# Patient Record
Sex: Male | Born: 1985 | Race: White | Hispanic: No | Marital: Single | Smoking: Current some day smoker
Health system: Southern US, Community
[De-identification: ages and names within clinical notes are randomized; demographics above are authoritative.]

## PROBLEM LIST (undated history)

## (undated) DIAGNOSIS — F259 Schizoaffective disorder, unspecified: Secondary | ICD-10-CM

## (undated) HISTORY — PX: HAND SURGERY: SHX662

---

## 2014-12-08 ENCOUNTER — Emergency Department (INDEPENDENT_AMBULATORY_CARE_PROVIDER_SITE_OTHER): Payer: Worker's Compensation

## 2014-12-08 ENCOUNTER — Emergency Department (INDEPENDENT_AMBULATORY_CARE_PROVIDER_SITE_OTHER)
Admission: EM | Admit: 2014-12-08 | Discharge: 2014-12-08 | Disposition: A | Discharge status: Home / Self Care | Payer: Worker's Compensation | Source: Home / Self Care | Attending: Family Medicine | Admitting: Family Medicine

## 2014-12-08 ENCOUNTER — Encounter (HOSPITAL_COMMUNITY): Payer: Self-pay | Admitting: *Deleted

## 2014-12-08 DIAGNOSIS — S6991XA Unspecified injury of right wrist, hand and finger(s), initial encounter: Secondary | ICD-10-CM

## 2014-12-08 HISTORY — DX: Schizoaffective disorder, unspecified: F25.9

## 2014-12-08 NOTE — ED Provider Notes (Signed)
CSN: [id number]     Arrival date & time 12/08/14  1748 History   First MD Initiated Contact with Patient 12/08/14 1830     Chief Complaint  Patient presents with  . Wrist Pain   (Consider location/radiation/quality/duration/timing/severity/associated sxs/prior Treatment) Patient is a 29 y.o. male presenting with wrist pain. The history is provided by the patient. No language interpreter was used.  Wrist Pain  Patient presents with complaint of right wrist pain, started yesterday at 2pm at work while applying force downward on a wrench.  Felt a sudden sharp pain along the dorsum/extensor surface of right wrist, heard a 'pop' sound.  The sharp pain subsided somewhat, came back later in the day and began to shoot toward fingers (digits #3 and 4).  This morning was a 5 on 10-point scale, now is not painful if he does not flex/extend the wrist. Is a 3/10 now with wrist movement.  The patient is R hand dominant.   PMHx; Prior history boxers fx on right hand 2 years ago.    ROS: No fevers or chills, was able to work today (did not have to utilize wrench at work today).   Social Hx; Patient is from [place], completing string of sequential weeks at work and will return to [place] at the end of the week for several weeks off.   Past Medical History  Diagnosis Date  . Schizoaffective disorder [facility])    Past Surgical History  Procedure Laterality Date  . Hand surgery     History reviewed. No pertinent family history. Social History  Substance Use Topics  . Smoking status: Current Some Day Smoker  . Smokeless tobacco: None  . Alcohol Use: Yes    Review of Systems  Allergies  Penicillins  Home Medications   Prior to Admission medications   Medication Sig Start Date End Date Taking? Authorizing Provider  Lurasidone HCl (LATUDA PO) Take by mouth.   Yes Historical Provider, MD   Meds Ordered and Administered this Visit  Medications - No data to display  BP 122/76 mmHg  Pulse 58   Temp(Src) 98.1 F (36.7 C) (Oral)  Resp 16  SpO2 99% No data found.   Physical Exam  Constitutional: He appears well-developed and well-nourished. No distress.  Neck: Normal range of motion. Neck supple.  Musculoskeletal:  Tenderness to palpation of Right wrist.  Right wrist with full active ROM flexion/extension, supination/pronation of R upper extremity. Handgrip full and symmetric. Sensation in finger tips is full and symmetric when compared to L.  Radial pulses palpated bilaterally.  Cap refill <3seconds bilaterally.  Elbow full flexion and extension bilaterally, no tenderness.   Skin: He is not diaphoretic.    ED Course  Procedures (including critical care time)  Labs Review Labs Reviewed - No data to display  Imaging Review No results found.   Visual Acuity Review  Right Eye Distance:   Left Eye Distance:   Bilateral Distance:    Right Eye Near:   Left Eye Near:    Bilateral Near:         MDM   1. Wrist injury, right, initial encounter    Patient with sudden onset R wrist pain while using a wrench at work.  X-ray of R wrist: negative for fracture or dislocation.  Plan for immobilization of R wrist temporarily, use of ibuprofen 400-600mg  by mouth every 6 hours as needed for pain/discomfort, restrictions at work for remainder of this week (until Oct 16th).  Patient will have extended time  away from work, to follow up with his regular care provider if pain persists or worsens despite rest and anti-inflammatory medication.     Barbaraann Barthel, MD 12/08/14 [phone]

## 2014-12-08 NOTE — ED Notes (Signed)
Pt   Reports      Injured        His  r    Wrist        Yesterday       -          twisting  A  Wrench        At  Work       Pain      Pain  On  Certain  Movements  And  posistions

## 2014-12-08 NOTE — Discharge Instructions (Signed)
It was a pleasure to see you today.   Your x-ray of the right wrist is negative for fracture.  We are fitting you with a right wrist splint and I recommend using for the next three days and avoid applying force with your right wrist.  Note for work is given today.   Ibuprofen  tablets (over-the-counter), 2 to 4 tablets by mouth every 6 hours with food, as needed for pain/swelling.   Follow up with your regular doctor if your wrist is not feeling better.

## 2016-04-06 IMAGING — DX DG WRIST COMPLETE 3+V*R*
4 series · 4 of 4 positions shown · non-contrast
Comparison: None.

CLINICAL DATA: 28-year-old male with acute right wrist pain
following twisting injury yesterday. Initial encounter.

EXAM:
RIGHT WRIST - COMPLETE 3+ VIEW

[wrist pa]
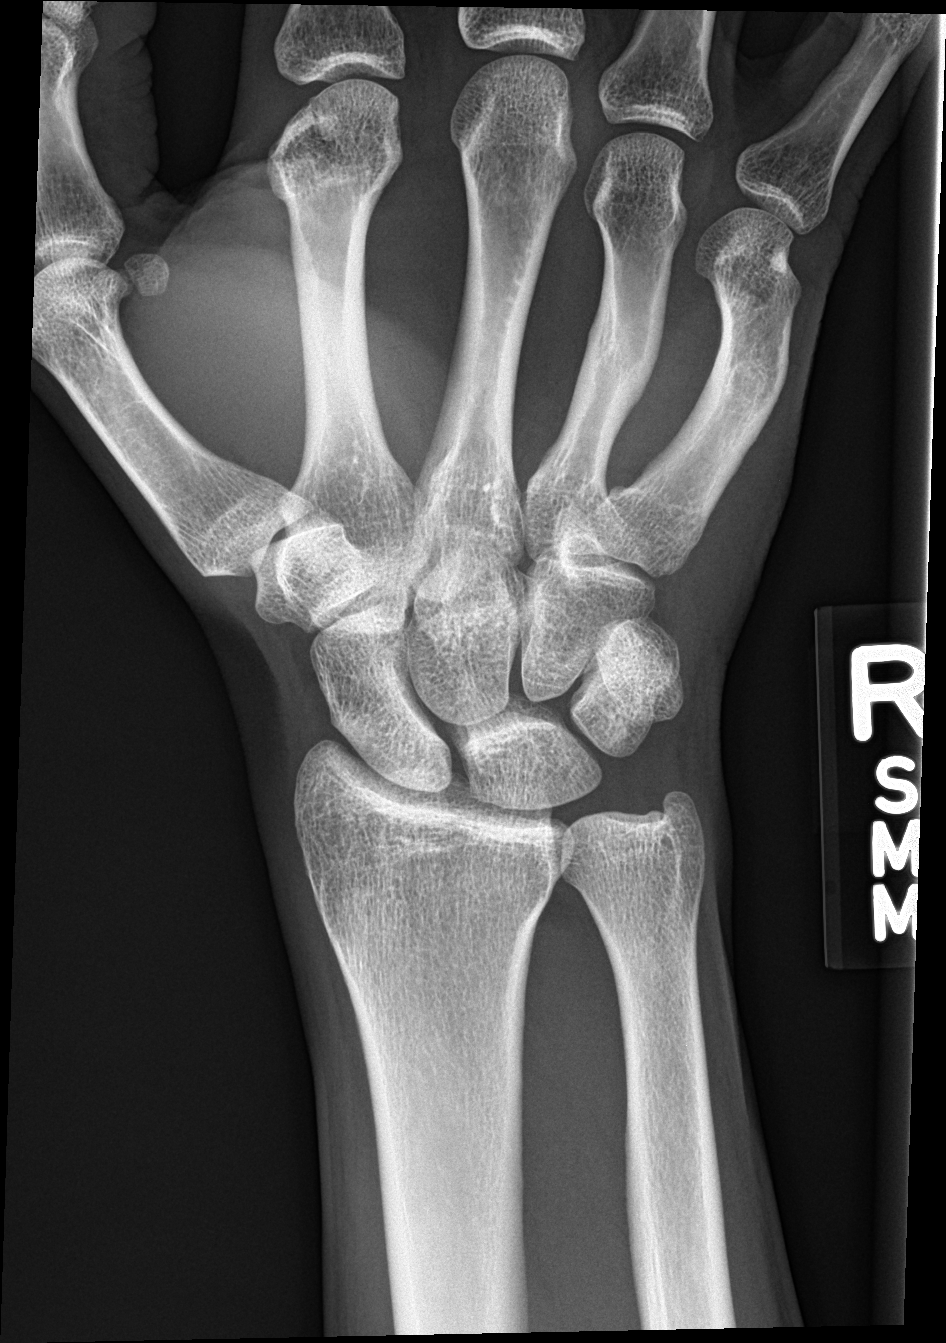

[wrist navicular]
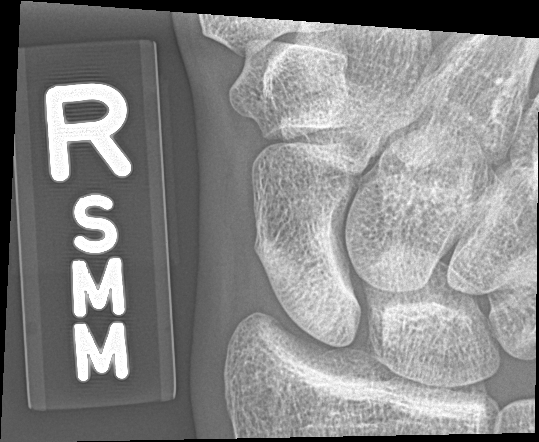

[wrist obl]
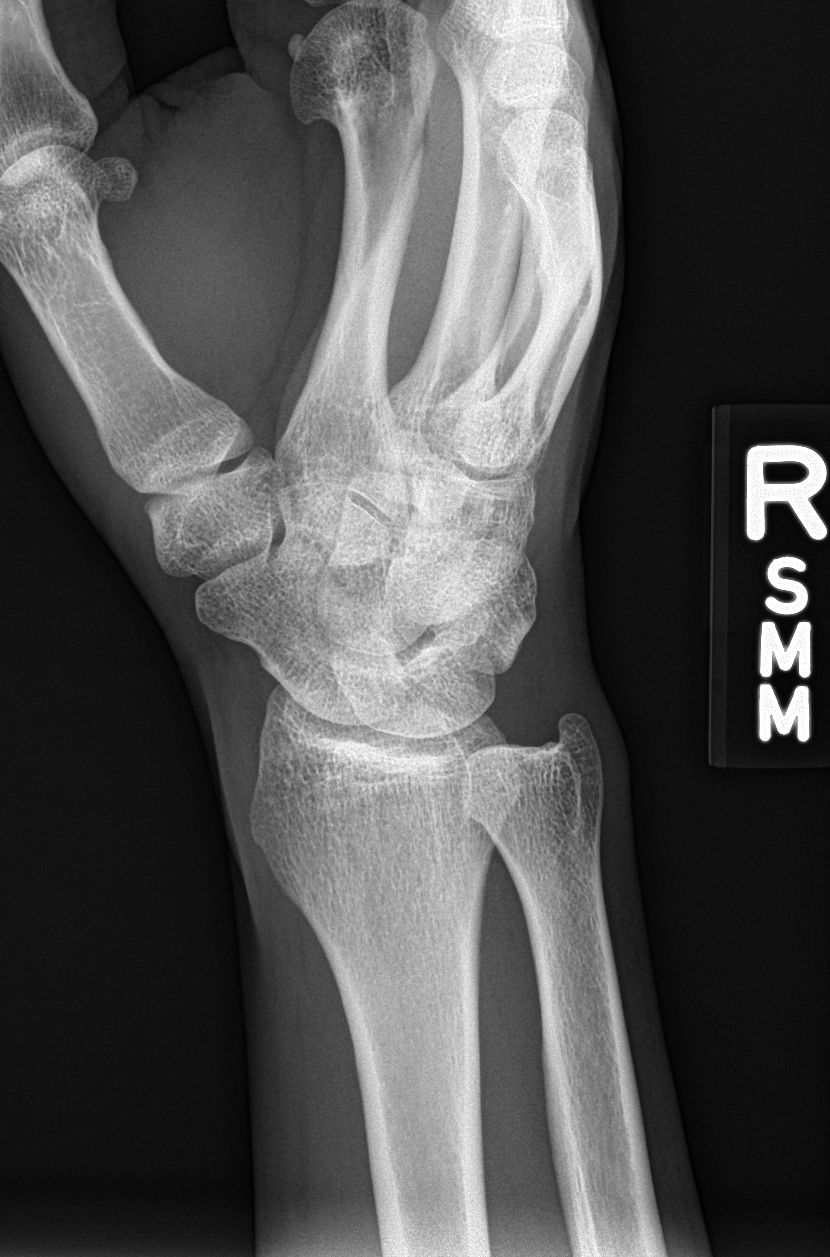

[wrist lat]
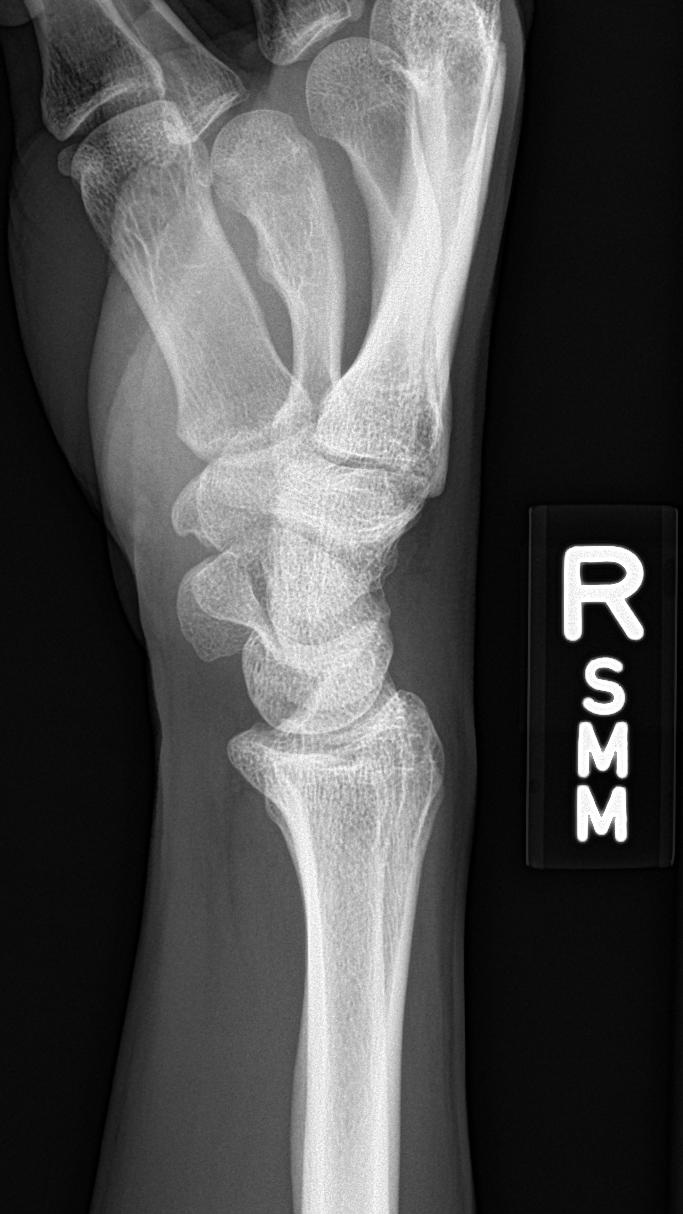

[4 of 4 positions shown; findings below may reference images not displayed]

FINDINGS: There is no evidence of acute fracture, subluxation or dislocation.

Remote fractures of the fourth and fifth metacarpals noted.

The joint spaces are unremarkable.

No soft tissue abnormalities are identified.
IMPRESSION: No evidence of acute abnormality.
